# Patient Record
Sex: Female | Born: 1950 | Race: Black or African American | Hispanic: No | Marital: Married | Smoking: Never smoker
Health system: Southern US, Community
[De-identification: ages and names within clinical notes are randomized; demographics above are authoritative.]

## PROBLEM LIST (undated history)

## (undated) DIAGNOSIS — I10 Essential (primary) hypertension: Secondary | ICD-10-CM

## (undated) DIAGNOSIS — N289 Disorder of kidney and ureter, unspecified: Secondary | ICD-10-CM

---

## 2004-11-16 ENCOUNTER — Other Ambulatory Visit: Payer: Self-pay

## 2004-11-21 ENCOUNTER — Ambulatory Visit: Payer: Self-pay | Admitting: Obstetrics and Gynecology

## 2005-07-10 ENCOUNTER — Ambulatory Visit: Payer: Self-pay | Admitting: Unknown Physician Specialty

## 2005-09-14 ENCOUNTER — Ambulatory Visit: Payer: Self-pay | Admitting: Unknown Physician Specialty

## 2006-01-23 ENCOUNTER — Ambulatory Visit: Payer: Self-pay | Admitting: Urology

## 2006-03-08 ENCOUNTER — Ambulatory Visit: Payer: Self-pay | Admitting: Urology

## 2006-03-26 ENCOUNTER — Ambulatory Visit: Payer: Self-pay | Admitting: Urology

## 2006-04-20 IMAGING — CR DG ABDOMEN 1V
1 series · 2 of 2 positions shown · non-contrast
Comparison: none

REASON FOR EXAM: Renal calculi-lithotripsy
COMMENTS:

[Series 1: view not recorded · 0.17mm/px · 2 of 2 slices shown]
[im 1/2]
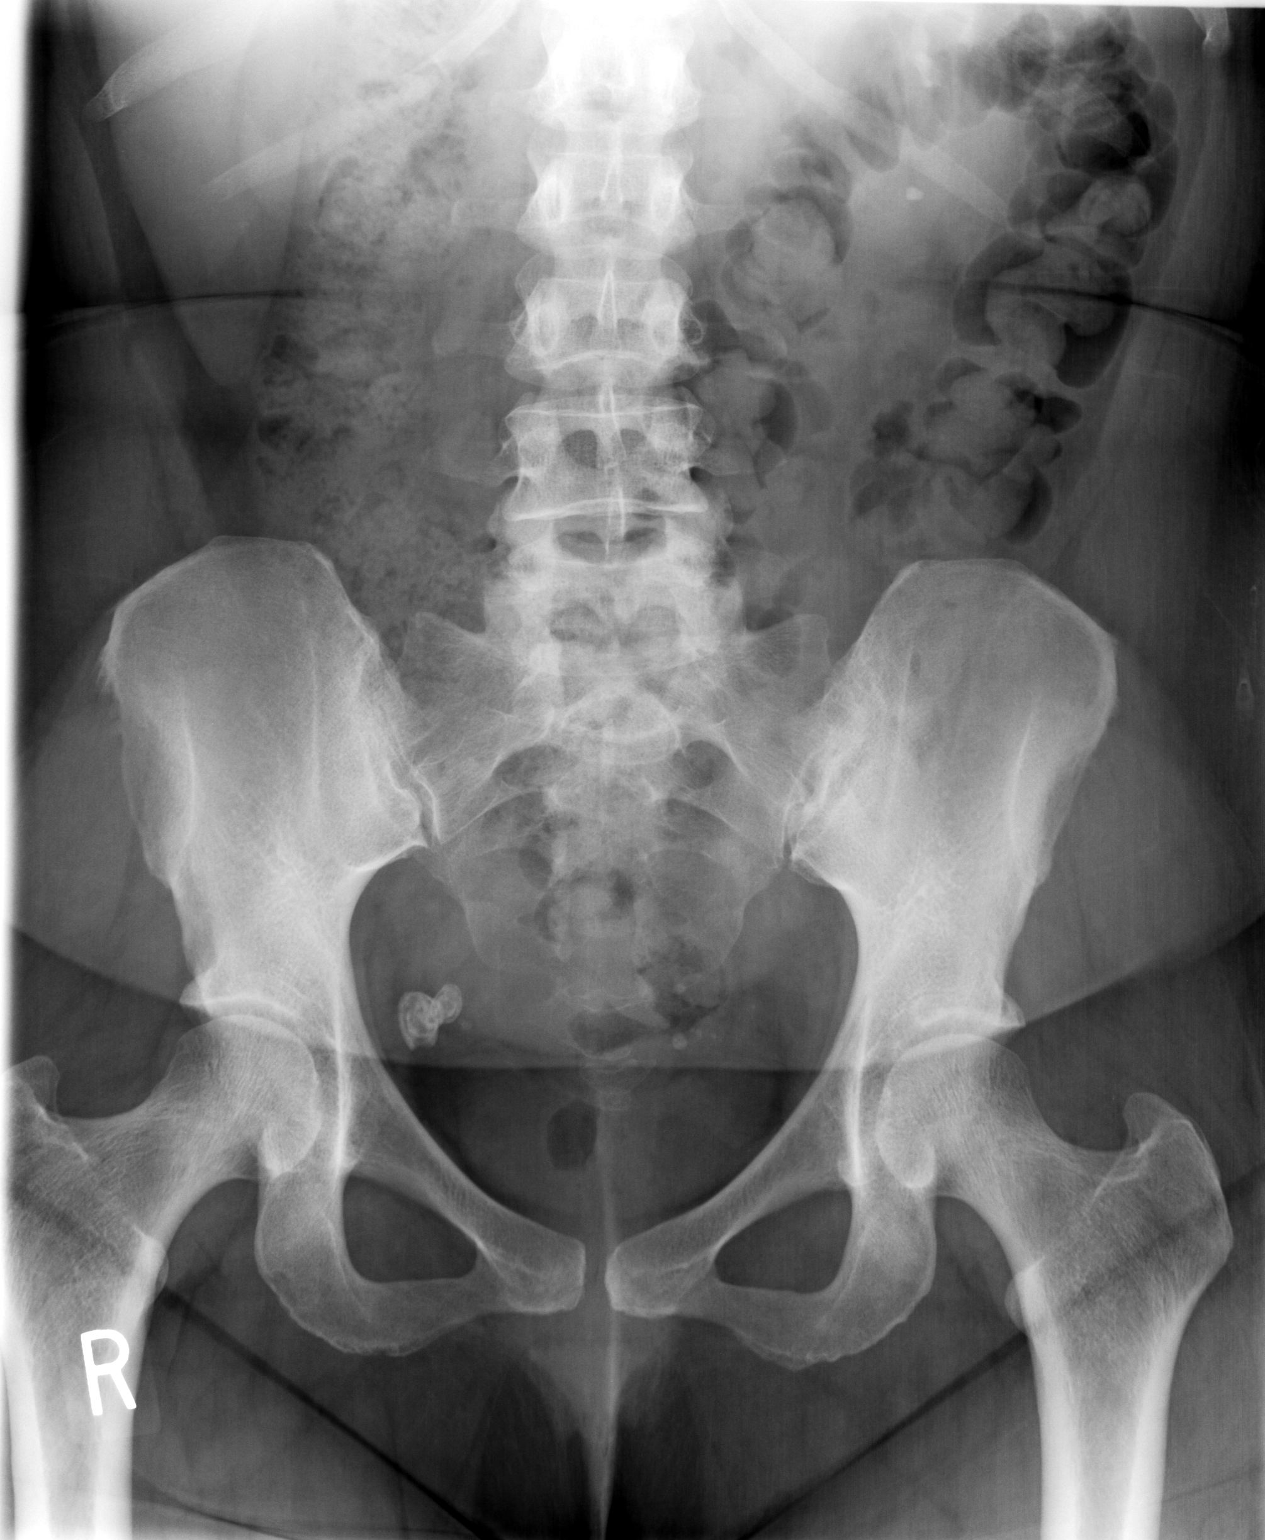
[im 2/2]
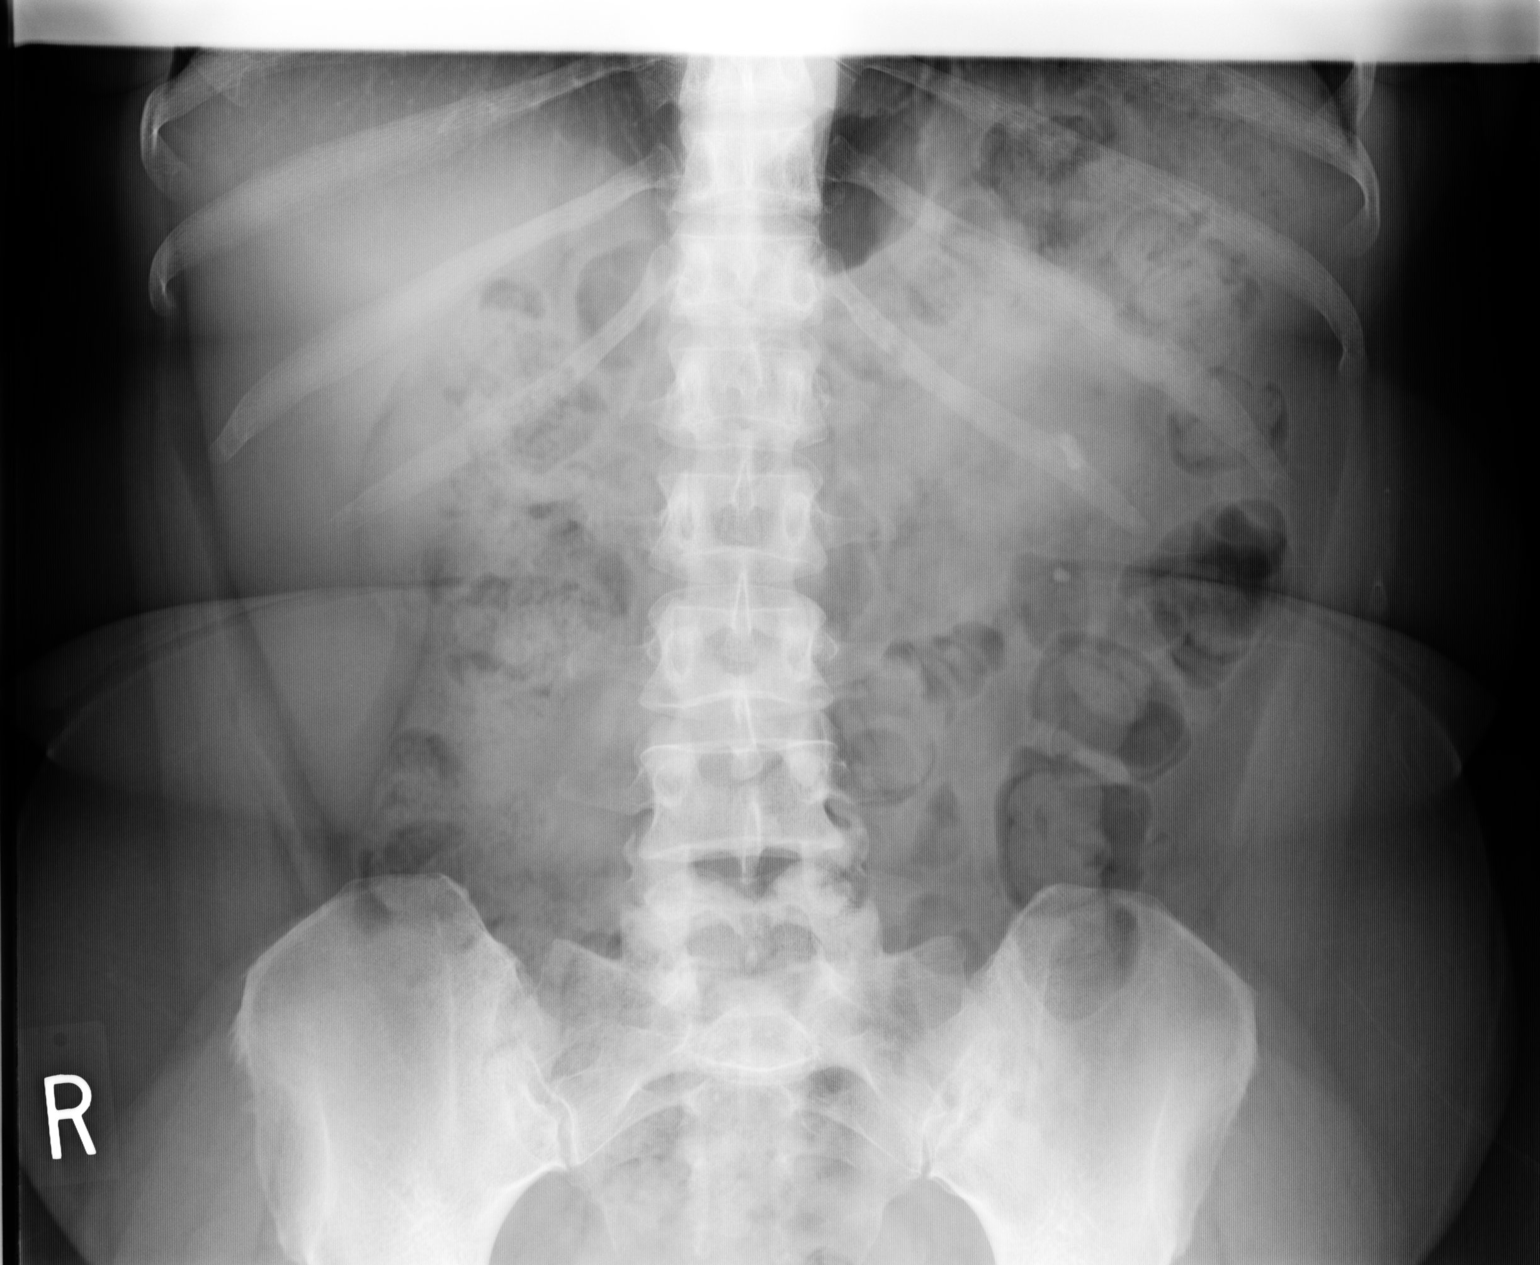

[2 of 2 positions shown; findings below may reference images not displayed]

PROCEDURE:     DXR - DXR KIDNEY URETER BLADDER  - March 08, 2006 [DATE]

RESULT:     The current exam is compared to the prior exam of 01/23/06.  There
is again noted a calcification measuring approximately 3 mm x 9 mm projected
over the midpole region of the LEFT kidney.  A 4-mm calcification is
visualized at the lower pole.  These do not appear appreciably changed in
position as compared to the prior exam.  No other renal or ureteral
calcifications are identified.  A few phleboliths are noted in the pelvis
bilaterally.

The bowel gas pattern shows no specific abnormalities.  There is observed a
moderate amount of fecal material in the colon.  The osseous structures are
normal in appearance.
IMPRESSION: There are observed two LEFT renal stones as noted above.

## 2006-07-27 ENCOUNTER — Ambulatory Visit: Payer: Self-pay | Admitting: Urology

## 2006-08-07 ENCOUNTER — Ambulatory Visit: Payer: Self-pay | Admitting: Unknown Physician Specialty

## 2007-02-04 ENCOUNTER — Ambulatory Visit: Payer: Self-pay | Admitting: Urology

## 2007-03-19 IMAGING — CR DG ABDOMEN 1V
1 series · 1 of 1 positions shown · non-contrast
Comparison: none

REASON FOR EXAM: Nephrolithiasis
COMMENTS:

[view not recorded]
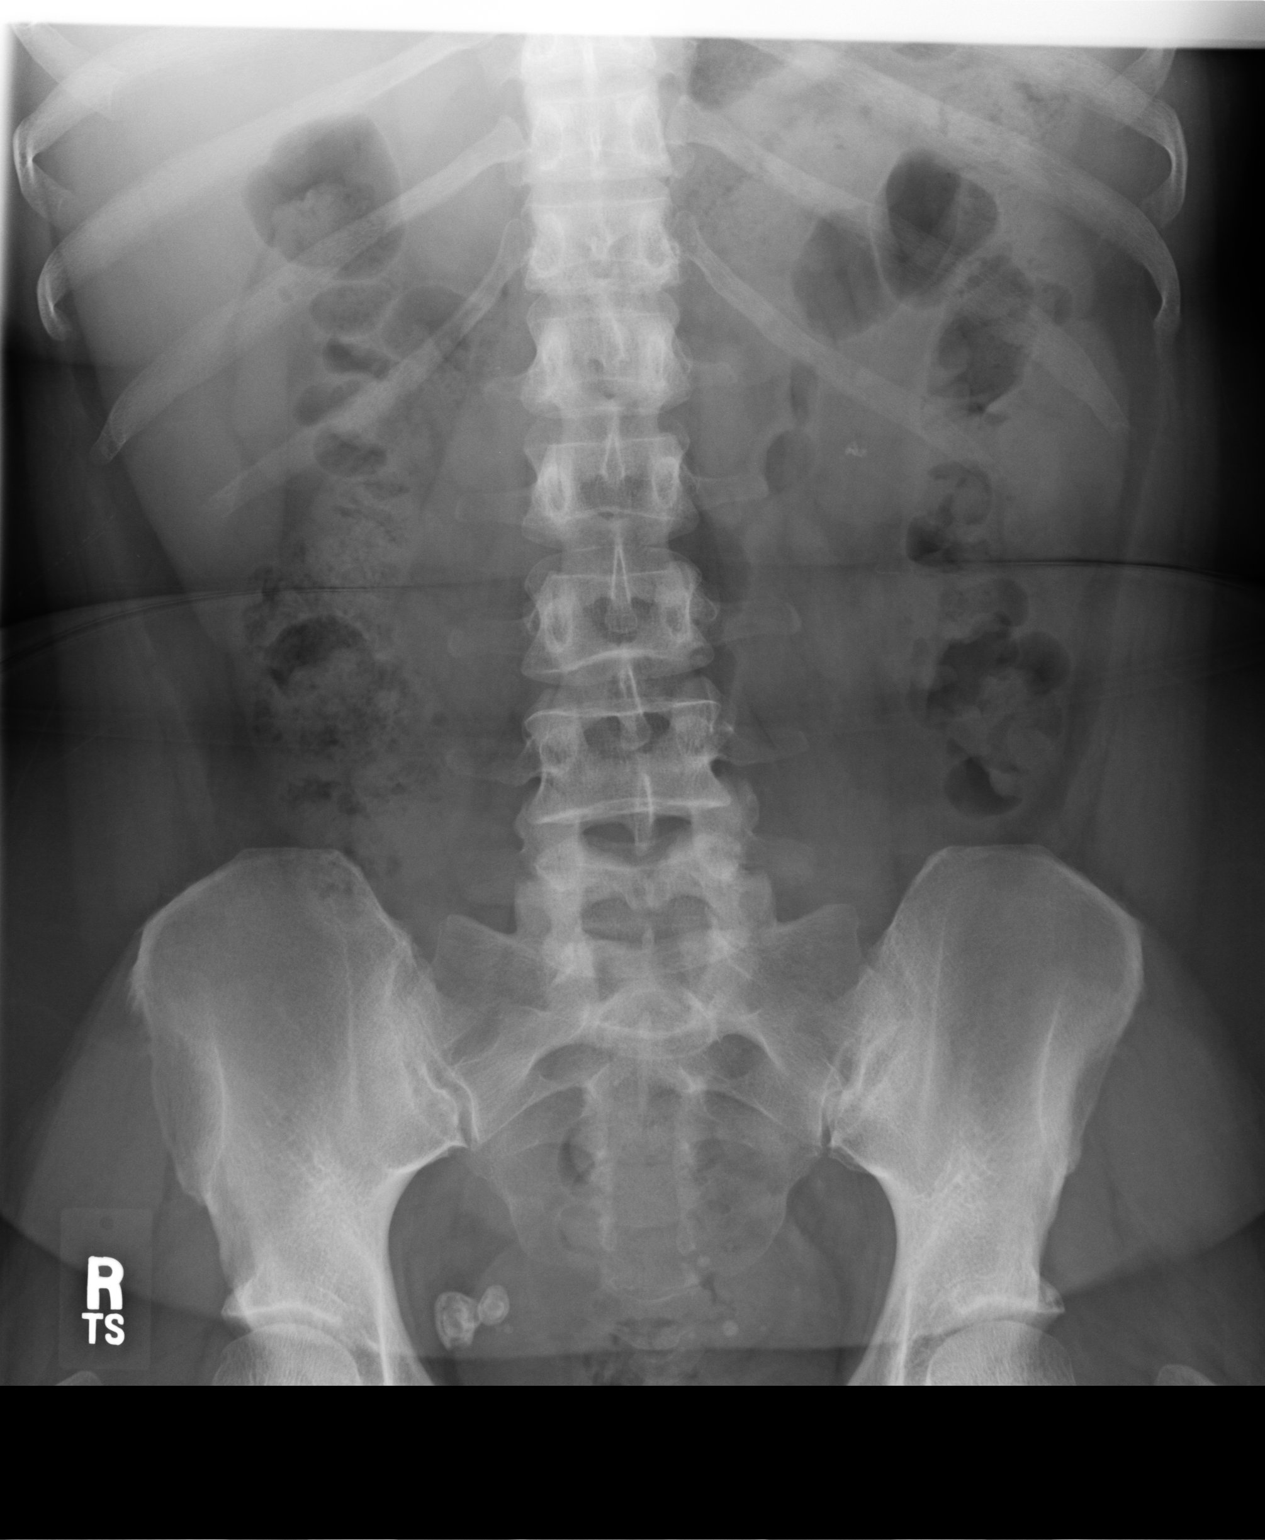

[1 of 1 positions shown; findings below may reference images not displayed]

PROCEDURE:     DXR - DXR KIDNEY URETER BLADDER  - February 04, 2007  [DATE]

RESULT:     AP view of the abdomen is compared to a prior exam of
07/27/2006.

There is again noted a calcification or cluster of small calcifications in
the mid pole region of the LEFT kidney. No other definite renal
calcifications are seen. Phleboliths are noted in the pelvis bilaterally.
IMPRESSION: Persistent, LEFT nephrolithiasis as noted above.

## 2007-06-12 ENCOUNTER — Ambulatory Visit: Payer: Self-pay | Admitting: Gastroenterology

## 2007-08-13 ENCOUNTER — Ambulatory Visit: Payer: Self-pay | Admitting: Unknown Physician Specialty

## 2007-11-06 ENCOUNTER — Ambulatory Visit: Payer: Self-pay | Admitting: Urology

## 2008-08-05 ENCOUNTER — Ambulatory Visit: Payer: Self-pay | Admitting: Urology

## 2008-08-14 ENCOUNTER — Ambulatory Visit: Payer: Self-pay | Admitting: Unknown Physician Specialty

## 2009-08-09 ENCOUNTER — Ambulatory Visit: Payer: Self-pay | Admitting: Urology

## 2009-08-31 ENCOUNTER — Ambulatory Visit: Payer: Self-pay | Admitting: Unknown Physician Specialty

## 2010-03-17 ENCOUNTER — Ambulatory Visit: Payer: Self-pay | Admitting: Podiatry

## 2010-08-10 ENCOUNTER — Ambulatory Visit: Payer: Self-pay | Admitting: Urology

## 2010-10-03 ENCOUNTER — Ambulatory Visit: Payer: Self-pay | Admitting: Unknown Physician Specialty

## 2011-08-14 ENCOUNTER — Ambulatory Visit: Payer: Self-pay | Admitting: Urology

## 2011-11-07 ENCOUNTER — Ambulatory Visit: Payer: Self-pay | Admitting: Unknown Physician Specialty

## 2012-08-14 ENCOUNTER — Ambulatory Visit: Payer: Self-pay | Admitting: Urology

## 2012-11-28 ENCOUNTER — Ambulatory Visit: Payer: Self-pay | Admitting: Physician Assistant

## 2013-12-02 ENCOUNTER — Ambulatory Visit: Payer: Self-pay | Admitting: Physician Assistant

## 2014-04-23 ENCOUNTER — Ambulatory Visit: Payer: Self-pay | Admitting: Urology

## 2015-01-26 ENCOUNTER — Ambulatory Visit: Payer: Self-pay | Admitting: Physician Assistant

## 2015-12-30 ENCOUNTER — Other Ambulatory Visit: Payer: Self-pay | Admitting: Physician Assistant

## 2015-12-30 DIAGNOSIS — Z1231 Encounter for screening mammogram for malignant neoplasm of breast: Secondary | ICD-10-CM

## 2016-01-28 ENCOUNTER — Ambulatory Visit
Admission: RE | Admit: 2016-01-28 | Discharge: 2016-01-28 | Disposition: A | Discharge status: Home / Self Care | Payer: BC Managed Care – PPO | Source: Ambulatory Visit | Attending: Physician Assistant | Admitting: Physician Assistant

## 2016-01-28 DIAGNOSIS — Z1231 Encounter for screening mammogram for malignant neoplasm of breast: Secondary | ICD-10-CM

## 2017-05-30 ENCOUNTER — Other Ambulatory Visit: Payer: Self-pay | Admitting: Physician Assistant

## 2017-05-30 DIAGNOSIS — Z1231 Encounter for screening mammogram for malignant neoplasm of breast: Secondary | ICD-10-CM

## 2017-06-14 ENCOUNTER — Ambulatory Visit
Admission: RE | Admit: 2017-06-14 | Discharge: 2017-06-14 | Disposition: A | Discharge status: Home / Self Care | Payer: Medicare Other | Source: Ambulatory Visit | Attending: Physician Assistant | Admitting: Physician Assistant

## 2017-06-14 DIAGNOSIS — Z1231 Encounter for screening mammogram for malignant neoplasm of breast: Secondary | ICD-10-CM | POA: Insufficient documentation

## 2018-07-25 ENCOUNTER — Other Ambulatory Visit: Payer: Self-pay | Admitting: Physician Assistant

## 2018-07-25 DIAGNOSIS — Z1231 Encounter for screening mammogram for malignant neoplasm of breast: Secondary | ICD-10-CM

## 2018-12-24 ENCOUNTER — Ambulatory Visit
Admission: RE | Admit: 2018-12-24 | Discharge: 2018-12-24 | Disposition: A | Discharge status: Home / Self Care | Payer: Medicare Other | Source: Ambulatory Visit | Attending: Physician Assistant | Admitting: Physician Assistant

## 2018-12-24 DIAGNOSIS — Z1231 Encounter for screening mammogram for malignant neoplasm of breast: Secondary | ICD-10-CM

## 2020-07-23 ENCOUNTER — Other Ambulatory Visit: Payer: Self-pay

## 2020-07-23 ENCOUNTER — Encounter: Payer: Self-pay | Admitting: Emergency Medicine

## 2020-07-23 ENCOUNTER — Ambulatory Visit
Admission: EM | Admit: 2020-07-23 | Discharge: 2020-07-23 | Disposition: A | Discharge status: Home / Self Care | Payer: Medicare PPO | Attending: Family Medicine | Admitting: Family Medicine

## 2020-07-23 DIAGNOSIS — M79604 Pain in right leg: Secondary | ICD-10-CM

## 2020-07-23 HISTORY — DX: Essential (primary) hypertension: I10

## 2020-07-23 HISTORY — DX: Disorder of kidney and ureter, unspecified: N28.9

## 2020-07-23 MED ORDER — TIZANIDINE HCL 2 MG PO TABS: 2.0000 mg | ORAL_TABLET | Freq: Every day | ORAL | 0 refills | Status: AC

## 2020-07-23 MED ORDER — MELOXICAM 7.5 MG PO TABS: 7.5000 mg | ORAL_TABLET | Freq: Every day | ORAL | 0 refills | Status: AC

## 2020-07-23 NOTE — ED Provider Notes (Signed)
[facility]    CSN: [id number] Arrival date & time: 07/23/20  1315      History   Chief Complaint Chief Complaint  Patient presents with  . Leg Pain    right lower leg    HPI Amanda Yoder is a 69 y.o. female.   Amanda Yoder presents with complaints of tightness pain to right calf. Worse if she sits. Improves with walking. Laying down seems to cause it to "freeze."  Has applied heat and cold which hasn't helped. Has taken ibuprofen which hasn't helped. Her doctor recommended stretching, hydration, ibuprofen, which haven't helped. Not worse, but not improving. Some days are better than others. She feels like 1 month ago approximately, had right hip pain. No further hip pain, back or thigh pain. Certain bending of the knee worsens the calf pain. Right after starting walking has more pain, which then improves as she moves more. No redness swelling or warmth. No knee injury. No chest pain , no shortness of breath . No blood clot history. No recent travel , no recent surgery or hospitalization. Denies any previous similar.    ROS per HPI, negative if not otherwise mentioned.      Past Medical History:  Diagnosis Date  . Hypertension   . Renal disorder     There are no problems to display for this patient.   History reviewed. No pertinent surgical history.  OB History   No obstetric history on file.      Home Medications    Prior to Admission medications   Medication Sig Start Date End Date Taking? Authorizing Provider  Cholecalciferol 25 MCG (1000 UT) capsule Take by mouth.   Yes [provider]  valsartan-hydrochlorothiazide (DIOVAN-HCT) 160-12.5 MG tablet Take 1 tablet by mouth daily. 05/24/20  Yes [provider]  meloxicam (MOBIC) 7.5 MG tablet Take 1 tablet (7.5 mg total) by mouth daily. 07/23/20   Georgetta Haber, NP  tiZANidine (ZANAFLEX) 2 MG tablet Take 1 tablet (2 mg total) by mouth at bedtime. 07/23/20   Georgetta Haber,  NP    Family History Family History  Problem Relation Age of Onset  . Breast cancer Maternal Aunt   . Breast cancer Paternal Aunt   . Hypertension Mother     Social History Social History   Tobacco Use  . Smoking status: Never Smoker  . Smokeless tobacco: Never Used  Vaping Use  . Vaping Use: Never used  Substance Use Topics  . Alcohol use: Not Currently  . Drug use: Never     Allergies   Patient has no known allergies.   Review of Systems Review of Systems   Physical Exam Triage Vital Signs ED Triage Vitals  Enc Vitals Group     BP 07/23/20 1410 137/84     Pulse Rate 07/23/20 1410 64     Resp 07/23/20 1410 14     Temp 07/23/20 1410 98.3 F (36.8 C)     Temp Source 07/23/20 1410 Oral     SpO2 07/23/20 1410 100 %     Weight 07/23/20 1406 195 lb (88.5 kg)     Height 07/23/20 1406 5\' 2"  (1.575 m)     Head Circumference --      Peak Flow --      Pain Score 07/23/20 1405 6     Pain Loc --      Pain Edu? --      Excl. in GC? --  No data found.  Updated Vital Signs BP 137/84 (BP Location: Right Arm)   Pulse 64   Temp 98.3 F (36.8 C) (Oral)   Resp 14   Ht 5\' 2"  (1.575 m)   Wt 195 lb (88.5 kg)   SpO2 100%   BMI 35.67 kg/m   Visual Acuity Right Eye Distance:   Left Eye Distance:   Bilateral Distance:    Right Eye Near:   Left Eye Near:    Bilateral Near:     Physical Exam Constitutional:      General: She is not in acute distress.    Appearance: She is well-developed.  Cardiovascular:     Rate and Rhythm: Normal rate.  Pulmonary:     Effort: Pulmonary effort is normal.  Musculoskeletal:       Legs:     Comments: Right posterior, lateral knee and proximal calf with pain, primarily if she flexes knee >90deg; no redness warmth or tenderness; strength equal bilaterally; gross sensation intact to lower extremities; negative homan's sign; strong pedal pulse and cap refill <2 seconds   Skin:    General: Skin is warm and dry.  Neurological:       Mental Status: She is alert and oriented to person, place, and time.      UC Treatments / Results  Labs (all labs ordered are listed, but only abnormal results are displayed) Labs Reviewed - No data to display  EKG   Radiology No results found.  Procedures Procedures (including critical care time)  Medications Ordered in UC Medications - No data to display  Initial Impression / Assessment and Plan / UC Course  I have reviewed the triage vital signs and the nursing notes.  Pertinent labs & imaging results that were available during my care of the patient were reviewed by me and considered in my medical decision making (see chart for details).     Posterior knee/ proximal calf pain for the past month. Improves with activity. Knee flexion worsens the pain. Findings are not consistent with DVT. Musculoskeletal source of pain vs vascular source considered. No foot pain. No swelling. nsaids and muscle relaxer provided at this time and encouraged follow up for recheck as may need further evaluation and treatment. Patient verbalized understanding and agreeable to plan.  Ambulatory out of clinic without difficulty.    Final Clinical Impressions(s) / UC Diagnoses   Final diagnoses:  Right leg pain     Discharge Instructions     You may try compression stockings to see if this is helpful.  This may be related to your knee, so I do think we will try a different antiinflammatory.  Meloxicam daily. Don't take additional ibuprofen for aleve. Take with food.  Tizandine as a muscle relaxer at [employer].  Drink plenty of fluids.  Please follow up with your primary care provider for recheck as you may need additional evaluation if it is persistent.     ED Prescriptions    Medication Sig Dispense Auth. Provider   meloxicam (MOBIC) 7.5 MG tablet Take 1 tablet (7.5 mg total) by mouth daily. 20 tablet B, NP   tiZANidine (ZANAFLEX) 2 MG tablet Take 1 tablet (2 mg total)  by mouth at bedtime. 20 tablet Linus Mako, NP     PDMP not reviewed this encounter.   Georgetta Haber, NP 07/23/20 1526

## 2020-07-23 NOTE — Discharge Instructions (Signed)
You may try compression stockings to see if this is helpful.  This may be related to your knee, so I do think we will try a different antiinflammatory.  Meloxicam daily. Don't take additional ibuprofen for aleve. Take with food.  Tizandine as a muscle relaxer at [employer].  Drink plenty of fluids.  Please follow up with your primary care provider for recheck as you may need additional evaluation if it is persistent.

## 2020-07-23 NOTE — ED Triage Notes (Signed)
Patient c/o right lower leg pain that started a month ago.  Patient denies injury or fall.

## 2020-10-12 ENCOUNTER — Other Ambulatory Visit: Payer: Self-pay | Admitting: Physician Assistant

## 2020-10-12 DIAGNOSIS — Z1231 Encounter for screening mammogram for malignant neoplasm of breast: Secondary | ICD-10-CM

## 2020-11-17 ENCOUNTER — Ambulatory Visit
Admission: RE | Admit: 2020-11-17 | Discharge: 2020-11-17 | Disposition: A | Discharge status: Home / Self Care | Payer: Medicare PPO | Source: Ambulatory Visit | Attending: Physician Assistant | Admitting: Physician Assistant

## 2020-11-17 ENCOUNTER — Other Ambulatory Visit: Payer: Self-pay

## 2020-11-17 DIAGNOSIS — Z1231 Encounter for screening mammogram for malignant neoplasm of breast: Secondary | ICD-10-CM | POA: Diagnosis present

## 2020-11-25 ENCOUNTER — Other Ambulatory Visit: Payer: Self-pay | Admitting: Physician Assistant

## 2020-11-25 DIAGNOSIS — N6489 Other specified disorders of breast: Secondary | ICD-10-CM

## 2020-11-25 DIAGNOSIS — R928 Other abnormal and inconclusive findings on diagnostic imaging of breast: Secondary | ICD-10-CM

## 2020-12-06 ENCOUNTER — Other Ambulatory Visit: Payer: Self-pay

## 2020-12-06 ENCOUNTER — Ambulatory Visit
Admission: RE | Admit: 2020-12-06 | Discharge: 2020-12-06 | Disposition: A | Discharge status: Home / Self Care | Payer: Medicare PPO | Source: Ambulatory Visit | Attending: Physician Assistant | Admitting: Physician Assistant

## 2020-12-06 DIAGNOSIS — N6489 Other specified disorders of breast: Secondary | ICD-10-CM

## 2020-12-06 DIAGNOSIS — R928 Other abnormal and inconclusive findings on diagnostic imaging of breast: Secondary | ICD-10-CM | POA: Diagnosis not present

## 2021-08-09 ENCOUNTER — Other Ambulatory Visit: Payer: Self-pay | Admitting: Physician Assistant

## 2021-08-09 DIAGNOSIS — Z1231 Encounter for screening mammogram for malignant neoplasm of breast: Secondary | ICD-10-CM

## 2022-01-17 ENCOUNTER — Other Ambulatory Visit: Payer: Self-pay

## 2022-01-17 ENCOUNTER — Ambulatory Visit
Admission: RE | Admit: 2022-01-17 | Discharge: 2022-01-17 | Disposition: A | Discharge status: Home / Self Care | Payer: Medicare PPO | Source: Ambulatory Visit | Attending: Physician Assistant | Admitting: Physician Assistant

## 2022-01-17 DIAGNOSIS — Z1231 Encounter for screening mammogram for malignant neoplasm of breast: Secondary | ICD-10-CM | POA: Insufficient documentation

## 2023-01-29 ENCOUNTER — Other Ambulatory Visit: Payer: Self-pay | Admitting: Physician Assistant

## 2023-01-29 DIAGNOSIS — Z1231 Encounter for screening mammogram for malignant neoplasm of breast: Secondary | ICD-10-CM

## 2023-02-14 ENCOUNTER — Ambulatory Visit
Admission: RE | Admit: 2023-02-14 | Discharge: 2023-02-14 | Disposition: A | Discharge status: Home / Self Care | Payer: Medicare PPO | Source: Ambulatory Visit | Attending: Physician Assistant | Admitting: Physician Assistant

## 2023-02-14 DIAGNOSIS — Z1231 Encounter for screening mammogram for malignant neoplasm of breast: Secondary | ICD-10-CM | POA: Insufficient documentation

## 2023-02-20 ENCOUNTER — Other Ambulatory Visit: Payer: Self-pay | Admitting: Physician Assistant

## 2023-02-20 DIAGNOSIS — R928 Other abnormal and inconclusive findings on diagnostic imaging of breast: Secondary | ICD-10-CM

## 2023-02-20 DIAGNOSIS — N6489 Other specified disorders of breast: Secondary | ICD-10-CM

## 2023-02-21 ENCOUNTER — Ambulatory Visit
Admission: RE | Admit: 2023-02-21 | Discharge: 2023-02-21 | Disposition: A | Discharge status: Home / Self Care | Payer: Medicare PPO | Source: Ambulatory Visit | Attending: Physician Assistant | Admitting: Physician Assistant

## 2023-02-21 ENCOUNTER — Other Ambulatory Visit: Payer: Self-pay | Admitting: Physician Assistant

## 2023-02-21 DIAGNOSIS — R928 Other abnormal and inconclusive findings on diagnostic imaging of breast: Secondary | ICD-10-CM

## 2023-02-21 DIAGNOSIS — N6489 Other specified disorders of breast: Secondary | ICD-10-CM | POA: Diagnosis present

## 2023-02-21 DIAGNOSIS — N6320 Unspecified lump in the left breast, unspecified quadrant: Secondary | ICD-10-CM

## 2023-02-22 ENCOUNTER — Other Ambulatory Visit: Payer: Self-pay | Admitting: Physician Assistant

## 2023-02-22 ENCOUNTER — Ambulatory Visit
Admission: RE | Admit: 2023-02-22 | Discharge: 2023-02-22 | Disposition: A | Discharge status: Home / Self Care | Payer: Medicare PPO | Source: Ambulatory Visit | Attending: Physician Assistant | Admitting: Physician Assistant

## 2023-02-22 DIAGNOSIS — N6320 Unspecified lump in the left breast, unspecified quadrant: Secondary | ICD-10-CM

## 2023-02-22 MED ORDER — IOPAMIDOL (ISOVUE-370) INJECTION 76%
100.0000 mL | Freq: Once | INTRAVENOUS | Status: AC | PRN
  Administered 2023-02-22: 100 mL via INTRAVENOUS

## 2023-02-23 ENCOUNTER — Other Ambulatory Visit: Payer: Self-pay | Admitting: Physician Assistant

## 2023-02-23 DIAGNOSIS — N6489 Other specified disorders of breast: Secondary | ICD-10-CM

## 2023-02-23 DIAGNOSIS — R928 Other abnormal and inconclusive findings on diagnostic imaging of breast: Secondary | ICD-10-CM

## 2023-03-01 ENCOUNTER — Ambulatory Visit
Admission: RE | Admit: 2023-03-01 | Discharge: 2023-03-01 | Disposition: A | Discharge status: Home / Self Care | Payer: Medicare PPO | Source: Ambulatory Visit | Attending: Physician Assistant | Admitting: Physician Assistant

## 2023-03-01 DIAGNOSIS — R928 Other abnormal and inconclusive findings on diagnostic imaging of breast: Secondary | ICD-10-CM | POA: Insufficient documentation

## 2023-03-01 DIAGNOSIS — N6489 Other specified disorders of breast: Secondary | ICD-10-CM

## 2023-03-01 HISTORY — PX: BREAST BIOPSY: SHX20

## 2023-03-01 MED ORDER — LIDOCAINE-EPINEPHRINE 1 %-1:100000 IJ SOLN
20.0000 mL | Freq: Once | INTRAMUSCULAR | Status: AC
  Administered 2023-03-01: 20 mL

## 2023-03-01 MED ORDER — LIDOCAINE HCL (PF) 1 % IJ SOLN
5.0000 mL | Freq: Once | INTRAMUSCULAR | Status: AC
  Administered 2023-03-01: 5 mL

## 2023-03-02 LAB — SURGICAL PATHOLOGY

## 2024-03-31 ENCOUNTER — Other Ambulatory Visit: Payer: Self-pay | Admitting: Physician Assistant

## 2024-03-31 DIAGNOSIS — Z1231 Encounter for screening mammogram for malignant neoplasm of breast: Secondary | ICD-10-CM

## 2024-04-30 ENCOUNTER — Ambulatory Visit
Admission: RE | Admit: 2024-04-30 | Discharge: 2024-04-30 | Disposition: A | Discharge status: Home / Self Care | Source: Ambulatory Visit | Attending: Physician Assistant | Admitting: Physician Assistant

## 2024-04-30 DIAGNOSIS — Z1231 Encounter for screening mammogram for malignant neoplasm of breast: Secondary | ICD-10-CM | POA: Insufficient documentation
